# Patient Record
Sex: Male | Born: 1970 | Race: White | Hispanic: No | Marital: Married | Smoking: Never smoker
Health system: Southern US, Community
[De-identification: ages and names within clinical notes are randomized; demographics above are authoritative.]

## PROBLEM LIST (undated history)

## (undated) DIAGNOSIS — L570 Actinic keratosis: Secondary | ICD-10-CM

## (undated) DIAGNOSIS — T7840XA Allergy, unspecified, initial encounter: Secondary | ICD-10-CM

## (undated) DIAGNOSIS — M1990 Unspecified osteoarthritis, unspecified site: Secondary | ICD-10-CM

## (undated) DIAGNOSIS — K219 Gastro-esophageal reflux disease without esophagitis: Secondary | ICD-10-CM

## (undated) HISTORY — PX: FRACTURE SURGERY: SHX138

## (undated) HISTORY — DX: Actinic keratosis: L57.0

## (undated) HISTORY — DX: Gastro-esophageal reflux disease without esophagitis: K21.9

## (undated) HISTORY — DX: Allergy, unspecified, initial encounter: T78.40XA

## (undated) HISTORY — DX: Unspecified osteoarthritis, unspecified site: M19.90

---

## 2009-11-14 LAB — HM COLONOSCOPY

## 2015-12-17 ENCOUNTER — Other Ambulatory Visit: Payer: Self-pay

## 2015-12-17 ENCOUNTER — Emergency Department (HOSPITAL_COMMUNITY): Payer: BC Managed Care – PPO

## 2015-12-17 ENCOUNTER — Encounter (HOSPITAL_COMMUNITY): Payer: Self-pay | Admitting: Radiology

## 2015-12-17 ENCOUNTER — Emergency Department (HOSPITAL_COMMUNITY)
Admission: EM | Admit: 2015-12-17 | Discharge: 2015-12-17 | Disposition: A | Discharge status: Home / Self Care | Payer: BC Managed Care – PPO | Attending: Emergency Medicine | Admitting: Emergency Medicine

## 2015-12-17 DIAGNOSIS — Y9389 Activity, other specified: Secondary | ICD-10-CM | POA: Diagnosis not present

## 2015-12-17 DIAGNOSIS — Y92410 Unspecified street and highway as the place of occurrence of the external cause: Secondary | ICD-10-CM | POA: Diagnosis not present

## 2015-12-17 DIAGNOSIS — Y998 Other external cause status: Secondary | ICD-10-CM | POA: Diagnosis not present

## 2015-12-17 DIAGNOSIS — S79912A Unspecified injury of left hip, initial encounter: Secondary | ICD-10-CM | POA: Diagnosis not present

## 2015-12-17 DIAGNOSIS — S20212A Contusion of left front wall of thorax, initial encounter: Secondary | ICD-10-CM | POA: Insufficient documentation

## 2015-12-17 DIAGNOSIS — T07XXXA Unspecified multiple injuries, initial encounter: Secondary | ICD-10-CM

## 2015-12-17 DIAGNOSIS — Z79899 Other long term (current) drug therapy: Secondary | ICD-10-CM | POA: Insufficient documentation

## 2015-12-17 DIAGNOSIS — R7989 Other specified abnormal findings of blood chemistry: Secondary | ICD-10-CM | POA: Insufficient documentation

## 2015-12-17 DIAGNOSIS — S0992XA Unspecified injury of nose, initial encounter: Secondary | ICD-10-CM | POA: Insufficient documentation

## 2015-12-17 DIAGNOSIS — S51022A Laceration with foreign body of left elbow, initial encounter: Secondary | ICD-10-CM | POA: Diagnosis not present

## 2015-12-17 DIAGNOSIS — S29001A Unspecified injury of muscle and tendon of front wall of thorax, initial encounter: Secondary | ICD-10-CM | POA: Diagnosis present

## 2015-12-17 DIAGNOSIS — S79911A Unspecified injury of right hip, initial encounter: Secondary | ICD-10-CM | POA: Diagnosis not present

## 2015-12-17 LAB — I-STAT CHEM 8, ED
BUN: 16 mg/dL (ref 6–20)
CHLORIDE: 104 mmol/L (ref 101–111)
Calcium, Ion: 1.12 mmol/L (ref 1.12–1.23)
Creatinine, Ser: 1.3 mg/dL — ABNORMAL HIGH (ref 0.61–1.24)
GLUCOSE: 140 mg/dL — AB (ref 65–99)
HCT: 49 % (ref 39.0–52.0)
Hemoglobin: 16.7 g/dL (ref 13.0–17.0)
POTASSIUM: 4.1 mmol/L (ref 3.5–5.1)
SODIUM: 142 mmol/L (ref 135–145)
TCO2: 24 mmol/L (ref 0–100)

## 2015-12-17 MED ORDER — TETANUS-DIPHTH-ACELL PERTUSSIS 5-2.5-18.5 LF-MCG/0.5 IM SUSP
0.5000 mL | Freq: Once | INTRAMUSCULAR | Status: AC
  Administered 2015-12-17: 0.5 mL via INTRAMUSCULAR
  Filled 2015-12-17: qty 0.5

## 2015-12-17 MED ORDER — BACITRACIN ZINC 500 UNIT/GM EX OINT
TOPICAL_OINTMENT | Freq: Two times a day (BID) | CUTANEOUS | Status: DC
  Administered 2015-12-17: 1 via TOPICAL
  Filled 2015-12-17: qty 0.9

## 2015-12-17 MED ORDER — SODIUM CHLORIDE 0.9 % IV BOLUS (SEPSIS)
1000.0000 mL | Freq: Once | INTRAVENOUS | Status: AC
  Administered 2015-12-17: 1000 mL via INTRAVENOUS

## 2015-12-17 MED ORDER — CEPHALEXIN 250 MG PO CAPS
500.0000 mg | ORAL_CAPSULE | Freq: Once | ORAL | Status: AC
  Administered 2015-12-17: 500 mg via ORAL
  Filled 2015-12-17: qty 2

## 2015-12-17 MED ORDER — CEPHALEXIN 500 MG PO CAPS: 500.0000 mg | ORAL_CAPSULE | Freq: Four times a day (QID) | ORAL | Status: DC

## 2015-12-17 MED ORDER — IOHEXOL 300 MG/ML  SOLN
100.0000 mL | Freq: Once | INTRAMUSCULAR | Status: AC | PRN
  Administered 2015-12-17: 100 mL via INTRAVENOUS

## 2015-12-17 MED ORDER — OXYCODONE-ACETAMINOPHEN 5-325 MG PO TABS: ORAL_TABLET | ORAL | Status: DC

## 2015-12-17 MED ORDER — ONDANSETRON HCL 4 MG/2ML IJ SOLN
4.0000 mg | Freq: Once | INTRAMUSCULAR | Status: AC
  Administered 2015-12-17: 4 mg via INTRAVENOUS
  Filled 2015-12-17: qty 2

## 2015-12-17 MED ORDER — HYDROMORPHONE HCL 1 MG/ML IJ SOLN
0.5000 mg | Freq: Once | INTRAMUSCULAR | Status: AC
  Administered 2015-12-17: 0.5 mg via INTRAVENOUS
  Filled 2015-12-17: qty 1

## 2015-12-17 MED ORDER — MORPHINE SULFATE (PF) 4 MG/ML IV SOLN
4.0000 mg | Freq: Once | INTRAVENOUS | Status: AC
  Administered 2015-12-17: 4 mg via INTRAVENOUS
  Filled 2015-12-17: qty 1

## 2015-12-17 MED ORDER — LIDOCAINE-EPINEPHRINE-TETRACAINE (LET) SOLUTION
3.0000 mL | Freq: Once | NASAL | Status: AC
  Administered 2015-12-17: 3 mL via TOPICAL
  Filled 2015-12-17: qty 3

## 2015-12-17 MED ORDER — OXYCODONE-ACETAMINOPHEN 5-325 MG PO TABS
1.0000 | ORAL_TABLET | Freq: Once | ORAL | Status: AC
  Administered 2015-12-17: 1 via ORAL
  Filled 2015-12-17: qty 1

## 2015-12-17 NOTE — ED Notes (Signed)
Pt transported to radiology.
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
## 2015-12-17 NOTE — Discharge Instructions (Signed)
Take percocet for breakthrough pain, do not drink alcohol, drive, care for children or do other critical tasks while taking percocet.  Your kidney tests today are not normal. You need to drink plenty of water and have this rechecked by your primary care doctor in the next week. Do not take any NSAIDs (motrin, ibuprofen, Advil, aleve , aspirin, naproxen etc.) because they can further hurt your kidneys.   If you see signs of infection (warmth, redness, tenderness, pus, sharp increase in pain, fever) immediately return to the emergency department. Take your antibiotics as directed  1) Wash gently morning and night with soap and water. 2) You may use a topical antibiotic ointment (bacitracin triple antibiotic ointment or neosporin, for example) 3) Cover all exposed tissue with the Xeroform petroleum dressing you were given today 4) Apply gauze and tape Do NOT use rubbing alcohol or hydrogen peroxide  Please follow with your primary care doctor in the next 2 days for a check-up. They must obtain records for further management.   Do not hesitate to return to the Emergency Department for any new, worsening or concerning symptoms.   Chest Contusion A chest contusion is a deep bruise on your chest area. Contusions are the result of an injury that caused bleeding under the skin. A chest contusion may involve bruising of the skin, muscles, or ribs. The contusion may turn blue, purple, or yellow. Minor injuries will give you a painless contusion, but more severe contusions may stay painful and swollen for a few weeks. CAUSES  A contusion is usually caused by a blow, trauma, or direct force to an area of the body. SYMPTOMS   Swelling and redness of the injured area.  Discoloration of the injured area.  Tenderness and soreness of the injured area.  Pain. DIAGNOSIS  The diagnosis can be made by taking a history and performing a physical exam. An X-ray, CT scan, or MRI may be needed to determine if  there were any associated injuries, such as broken bones (fractures) or internal injuries. TREATMENT  Often, the best treatment for a chest contusion is resting, icing, and applying cold compresses to the injured area. Deep breathing exercises may be recommended to reduce the risk of pneumonia. Over-the-counter medicines may also be recommended for pain control. HOME CARE INSTRUCTIONS   Put ice on the injured area.  Put ice in a plastic bag.  Place a towel between your skin and the bag.  Leave the ice on for 15-20 minutes, 03-04 times a day.  Only take over-the-counter or prescription medicines as directed by your caregiver. Your caregiver may recommend avoiding anti-inflammatory medicines (aspirin, ibuprofen, and naproxen) for 48 hours because these medicines may increase bruising.  Rest the injured area.  Perform deep-breathing exercises as directed by your caregiver.  Stop smoking if you smoke.  Do not lift objects over 5 pounds (2.3 kg) for 3 days or longer if recommended by your caregiver. SEEK IMMEDIATE MEDICAL CARE IF:   You have increased bruising or swelling.  You have pain that is getting worse.  You have difficulty breathing.  You have dizziness, weakness, or fainting.  You have blood in your urine or stool.  You cough up or vomit blood.  Your swelling or pain is not relieved with medicines. MAKE SURE YOU:   Understand these instructions.  Will watch your condition.  Will get help right away if you are not doing well or get worse.   This information is not intended to replace advice  given to you by your health care provider. Make sure you discuss any questions you have with your health care provider.   Document Released: 07/03/2001 Document Revised: 07/02/2012 Document Reviewed: 03/31/2012 Elsevier Interactive Patient Education [employer]

## 2015-12-17 NOTE — ED Notes (Signed)
Pt returned from xray
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
## 2015-12-17 NOTE — ED Notes (Signed)
Printer not working on pod D told Delo to look in computer 

## 2015-12-17 NOTE — ED Notes (Signed)
Pt. Has bloody drainage to nasal area on assessment.

## 2015-12-17 NOTE — ED Notes (Signed)
Pt returned from CT °

## 2015-12-17 NOTE — ED Notes (Signed)
Pt transported to CT ?
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
## 2020-06-30 ENCOUNTER — Ambulatory Visit (HOSPITAL_COMMUNITY)
Admission: RE | Admit: 2020-06-30 | Discharge: 2020-06-30 | Disposition: A | Discharge status: Home / Self Care | Payer: BC Managed Care – PPO | Source: Ambulatory Visit | Attending: Pulmonary Disease | Admitting: Pulmonary Disease

## 2020-06-30 ENCOUNTER — Other Ambulatory Visit: Payer: Self-pay | Admitting: Oncology

## 2020-06-30 ENCOUNTER — Telehealth: Payer: Self-pay | Admitting: Oncology

## 2020-06-30 DIAGNOSIS — U071 COVID-19: Secondary | ICD-10-CM

## 2020-06-30 MED ORDER — EPINEPHRINE 0.3 MG/0.3ML IJ SOAJ: 0.3000 mg | Freq: Once | INTRAMUSCULAR | Status: DC | PRN

## 2020-06-30 MED ORDER — ALBUTEROL SULFATE HFA 108 (90 BASE) MCG/ACT IN AERS: 2.0000 | INHALATION_SPRAY | Freq: Once | RESPIRATORY_TRACT | Status: DC | PRN

## 2020-06-30 MED ORDER — FAMOTIDINE IN NACL 20-0.9 MG/50ML-% IV SOLN: 20.0000 mg | Freq: Once | INTRAVENOUS | Status: DC | PRN

## 2020-06-30 MED ORDER — METHYLPREDNISOLONE SODIUM SUCC 125 MG IJ SOLR: 125.0000 mg | Freq: Once | INTRAMUSCULAR | Status: DC | PRN

## 2020-06-30 MED ORDER — DIPHENHYDRAMINE HCL 50 MG/ML IJ SOLN: 50.0000 mg | Freq: Once | INTRAMUSCULAR | Status: DC | PRN

## 2020-06-30 MED ORDER — SODIUM CHLORIDE 0.9 % IV SOLN: INTRAVENOUS | Status: DC | PRN

## 2020-06-30 MED ORDER — SODIUM CHLORIDE 0.9 % IV SOLN
1200.0000 mg | Freq: Once | INTRAVENOUS | Status: AC
  Administered 2020-06-30: 1200 mg via INTRAVENOUS
  Filled 2020-06-30: qty 10

## 2020-06-30 NOTE — Progress Notes (Signed)
  Diagnosis: COVID-19  Physician:Dr. Wright  Procedure: [facility] Med: casirivimab\imdevimab infusion - Provided patient with casirivimab\imdevimab fact sheet for patients, parents and caregivers prior to infusion.  Complications: No immediate complications noted.  Discharge: Discharged home   Marc Barber 06/30/2020  

## 2020-06-30 NOTE — Telephone Encounter (Signed)
I connected by phone with  Marc Barber to discuss the potential use of an new treatment for mild to moderate COVID-19 viral infection in non-hospitalized patients.   This patient is a age/sex that meets the FDA criteria for Emergency Use Authorization of casirivimab\imdevimab.  Has a (+) direct SARS-CoV-2 viral test result 1. Has mild or moderate COVID-19  2. Is ? 47 years of age and weighs ? 40 kg 3. Is NOT hospitalized due to COVID-19 4. Is NOT requiring oxygen therapy or requiring an increase in baseline oxygen flow rate due to COVID-19 5. Is within 10 days of symptom onset 6. Has at least one of the high risk factor(s) for progression to severe COVID-19 and/or hospitalization as defined in EUA. ? Specific high risk criteria : Obesity   Symptom onset  06/26/2020   I have spoken and communicated the following to the patient or parent/caregiver:   1. FDA has authorized the emergency use of casirivimab\imdevimab for the treatment of mild to moderate COVID-19 in adults and pediatric patients with positive results of direct SARS-CoV-2 viral testing who are 61 years of age and older weighing at least 40 kg, and who are at high risk for progressing to severe COVID-19 and/or hospitalization.   2. The significant known and potential risks and benefits of casirivimab\imdevimab, and the extent to which such potential risks and benefits are unknown.   3. Information on available alternative treatments and the risks and benefits of those alternatives, including clinical trials.   4. Patients treated with casirivimab\imdevimab should continue to self-isolate and use infection control measures (e.g., wear mask, isolate, social distance, avoid sharing personal items, clean and disinfect "high touch" surfaces, and frequent handwashing) according to CDC guidelines.    5. The patient or parent/caregiver has the option to accept or refuse casirivimab\imdevimab .   After reviewing this information with the  patient, The patient agreed to proceed with receiving casirivimab\imdevimab infusion and will be provided a copy of the Fact sheet prior to receiving the infusion.Mignon Pine, AGNP-C [phone] (Infusion Center Hotline)

## 2020-06-30 NOTE — Discharge Instructions (Signed)

## 2023-09-10 ENCOUNTER — Ambulatory Visit: Payer: BC Managed Care – PPO | Admitting: Family Medicine

## 2023-09-10 ENCOUNTER — Ambulatory Visit
Admission: RE | Admit: 2023-09-10 | Discharge: 2023-09-10 | Disposition: A | Discharge status: Home / Self Care | Payer: BC Managed Care – PPO | Source: Ambulatory Visit | Attending: Family Medicine

## 2023-09-10 ENCOUNTER — Encounter: Payer: Self-pay | Admitting: Family Medicine

## 2023-09-10 VITALS — BP 136/74 | HR 70 | Temp 97.8°F | Ht 69.5 in | Wt 235.8 lb

## 2023-09-10 DIAGNOSIS — K219 Gastro-esophageal reflux disease without esophagitis: Secondary | ICD-10-CM | POA: Diagnosis not present

## 2023-09-10 DIAGNOSIS — R079 Chest pain, unspecified: Secondary | ICD-10-CM

## 2023-09-10 DIAGNOSIS — K222 Esophageal obstruction: Secondary | ICD-10-CM

## 2023-09-10 DIAGNOSIS — R2232 Localized swelling, mass and lump, left upper limb: Secondary | ICD-10-CM

## 2023-09-10 NOTE — Patient Instructions (Addendum)
-   Schedule an appointment with general surgery to evaluate your left arm mass. - Obtain a chest X-ray to assess your left rib cage pain. - Continue taking omeprazole daily as directed to manage your heartburn. - Return for fasting blood tests for physical

## 2023-09-10 NOTE — Progress Notes (Unsigned)
Assessment/Plan:   Problem List Items Addressed This Visit   None   There are no discontinued medications.  No follow-ups on file.    Subjective:   Encounter date: 09/10/2023  Marc Barber is a 52 y.o. male who does not have a problem list on file.Marland Kitchen   He  has no past medical history on file.Marland Kitchen   He presents with chief complaint of Establish Care (Growth on left upper arm , sore to the touch x 1 year. Left rib cage pain off and on. Patient would like to discuss colonoscopy options ) .   HPI:   ROS  History reviewed. No pertinent surgical history.  Outpatient Medications Prior to Visit  Medication Sig Dispense Refill   ibuprofen (ADVIL,MOTRIN) 400 MG tablet Take 400 mg by mouth every 6 (six) hours as needed for mild pain.     omeprazole (PRILOSEC) 20 MG capsule Take 20 mg by mouth daily.     cephALEXin (KEFLEX) 500 MG capsule Take 1 capsule (500 mg total) by mouth 4 (four) times daily. (Patient not taking: Reported on 09/10/2023) 20 capsule 0   oxyCODONE-acetaminophen (PERCOCET/ROXICET) 5-325 MG tablet 1 to 2 tabs PO q6hrs  PRN for pain (Patient not taking: Reported on 09/10/2023) 15 tablet 0   No facility-administered medications prior to visit.    Family History  Problem Relation Age of Onset   Diabetes Mother    Diabetes Father    Hypertension Father     Social History   Socioeconomic History   Marital status: Married    Spouse name: Not on file   Number of children: Not on file   Years of education: Not on file   Highest education level: Not on file  Occupational History   Not on file  Tobacco Use   Smoking status: Never    Passive exposure: Never   Smokeless tobacco: Never  Vaping Use   Vaping status: Never Used  Substance and Sexual Activity   Alcohol use: Yes    Comment: Occassionally   Drug use: Not on file   Sexual activity: Not on file  Other Topics Concern   Not on file  Social History Narrative   Not on file   Social Determinants of  Health   Financial Resource Strain: Not on file  Food Insecurity: Not on file  Transportation Needs: Not on file  Physical Activity: Not on file  Stress: Not on file  Social Connections: Not on file  Intimate Partner Violence: Not on file                                                                                                  Objective:  Physical Exam: BP 136/74 (BP Location: Left Arm, Patient Position: Sitting, Cuff Size: Large)   Pulse 70   Temp 97.8 F (36.6 C) (Temporal)   Ht 5' 9.5" (1.765 m)   Wt 235 lb 12.8 oz (107 kg)   SpO2 99%   BMI 34.32 kg/m     Physical Exam  No results found.  No results found for this or any previous  visit (from the past 2160 hour(s)).      Garner Nash, MD, MS

## 2023-09-12 DIAGNOSIS — R079 Chest pain, unspecified: Secondary | ICD-10-CM | POA: Insufficient documentation

## 2023-09-12 DIAGNOSIS — K222 Esophageal obstruction: Secondary | ICD-10-CM | POA: Insufficient documentation

## 2023-09-12 DIAGNOSIS — K219 Gastro-esophageal reflux disease without esophagitis: Secondary | ICD-10-CM | POA: Insufficient documentation

## 2023-09-12 DIAGNOSIS — R2232 Localized swelling, mass and lump, left upper limb: Secondary | ICD-10-CM | POA: Insufficient documentation

## 2023-09-12 NOTE — Assessment & Plan Note (Signed)
Stable on omeprazole  Plan: Continue current regimen of omeprazole 20 mg daily. Reinforce importance of medication adherence. Consider gastroenterology referral if symptoms worsen.

## 2023-09-12 NOTE — Assessment & Plan Note (Addendum)
Intermittent atypical left chest wall pain, tender to palpation, no associated respiratory or cardiac symptoms.  Differential Diagnosis: Costochondritis Muscular strain Fracture of the ribs Referred pain from GERD Less likely cardiac etiology  Plan: EKG performed today; sinus rhythm, within normal limits. Order chest X-ray to evaluate for musculoskeletal abnormalities or other pathology. Advise use of over-the-counter analgesics (e.g., NSAIDs) as needed for pain. Follow up as needed

## 2023-09-12 NOTE — Assessment & Plan Note (Signed)
Worsening left arm nodule causing pain and functional impairment.  Differential Diagnosis: Neural tumor (e.g., Schwannoma) Fibroma Lipoma (less likely due to hardness and pain) Malignant soft tissue tumor (e.g., sarcoma)  Plan: Refer to general surgery for evaluation and possible excision. No imaging prior to surgical consult per patient preference and clinical indication.

## 2023-10-08 ENCOUNTER — Encounter: Payer: BC Managed Care – PPO | Admitting: Family Medicine

## 2023-10-21 ENCOUNTER — Telehealth: Payer: Self-pay | Admitting: Family Medicine

## 2023-10-21 NOTE — Telephone Encounter (Signed)
error 

## 2023-10-31 ENCOUNTER — Encounter: Payer: BC Managed Care – PPO | Admitting: Family Medicine

## 2023-12-17 ENCOUNTER — Encounter: Payer: BC Managed Care – PPO | Admitting: Family Medicine

## 2024-01-07 ENCOUNTER — Ambulatory Visit (INDEPENDENT_AMBULATORY_CARE_PROVIDER_SITE_OTHER): Payer: BC Managed Care – PPO | Admitting: Family Medicine

## 2024-01-07 ENCOUNTER — Encounter: Payer: Self-pay | Admitting: Family Medicine

## 2024-01-07 VITALS — BP 130/84 | HR 82 | Temp 97.0°F | Ht 69.5 in | Wt 238.4 lb

## 2024-01-07 DIAGNOSIS — Z91013 Allergy to seafood: Secondary | ICD-10-CM | POA: Insufficient documentation

## 2024-01-07 DIAGNOSIS — R079 Chest pain, unspecified: Secondary | ICD-10-CM

## 2024-01-07 DIAGNOSIS — Z125 Encounter for screening for malignant neoplasm of prostate: Secondary | ICD-10-CM | POA: Diagnosis not present

## 2024-01-07 DIAGNOSIS — K222 Esophageal obstruction: Secondary | ICD-10-CM | POA: Diagnosis not present

## 2024-01-07 DIAGNOSIS — Z6834 Body mass index (BMI) 34.0-34.9, adult: Secondary | ICD-10-CM | POA: Diagnosis not present

## 2024-01-07 DIAGNOSIS — E6609 Other obesity due to excess calories: Secondary | ICD-10-CM

## 2024-01-07 DIAGNOSIS — Z0000 Encounter for general adult medical examination without abnormal findings: Secondary | ICD-10-CM | POA: Diagnosis not present

## 2024-01-07 DIAGNOSIS — Z1211 Encounter for screening for malignant neoplasm of colon: Secondary | ICD-10-CM

## 2024-01-07 DIAGNOSIS — E66811 Obesity, class 1: Secondary | ICD-10-CM

## 2024-01-07 DIAGNOSIS — L570 Actinic keratosis: Secondary | ICD-10-CM

## 2024-01-07 DIAGNOSIS — K219 Gastro-esophageal reflux disease without esophagitis: Secondary | ICD-10-CM

## 2024-01-07 LAB — COMPREHENSIVE METABOLIC PANEL
ALT: 30 U/L (ref 0–53)
AST: 19 U/L (ref 0–37)
Albumin: 4.9 g/dL (ref 3.5–5.2)
Alkaline Phosphatase: 69 U/L (ref 39–117)
BUN: 15 mg/dL (ref 6–23)
CO2: 26 meq/L (ref 19–32)
Calcium: 10.1 mg/dL (ref 8.4–10.5)
Chloride: 101 meq/L (ref 96–112)
Creatinine, Ser: 1.28 mg/dL (ref 0.40–1.50)
GFR: 65.28 mL/min (ref >60.00)
Glucose, Bld: 118 mg/dL — ABNORMAL HIGH (ref 70–99)
Potassium: 4.6 meq/L (ref 3.5–5.1)
Sodium: 137 meq/L (ref 135–145)
Total Bilirubin: 0.5 mg/dL (ref 0.2–1.2)
Total Protein: 7.5 g/dL (ref 6.0–8.3)

## 2024-01-07 LAB — CBC WITH DIFFERENTIAL/PLATELET
Basophils Absolute: 0.1 10*3/uL (ref 0.0–0.1)
Basophils Relative: 0.9 % (ref 0.0–3.0)
Eosinophils Absolute: 0.1 10*3/uL (ref 0.0–0.7)
Eosinophils Relative: 1.9 % (ref 0.0–5.0)
HCT: 47.3 % (ref 39.0–52.0)
Hemoglobin: 15.7 g/dL (ref 13.0–17.0)
Lymphocytes Relative: 25.2 % (ref 12.0–46.0)
Lymphs Abs: 1.6 10*3/uL (ref 0.7–4.0)
MCHC: 33.3 g/dL (ref 30.0–36.0)
MCV: 92.9 fl (ref 78.0–100.0)
Monocytes Absolute: 0.5 10*3/uL (ref 0.1–1.0)
Monocytes Relative: 7.9 % (ref 3.0–12.0)
Neutro Abs: 4.1 10*3/uL (ref 1.4–7.7)
Neutrophils Relative %: 64.1 % (ref 43.0–77.0)
Platelets: 272 10*3/uL (ref 150.0–400.0)
RBC: 5.09 Mil/uL (ref 4.22–5.81)
RDW: 13.3 % (ref 11.5–15.5)
WBC: 6.4 10*3/uL (ref 4.0–10.5)

## 2024-01-07 LAB — LIPID PANEL
Cholesterol: 238 mg/dL — ABNORMAL HIGH (ref 0–200)
HDL: 53.9 mg/dL (ref >39.00)
LDL Cholesterol: 168 mg/dL — ABNORMAL HIGH (ref 0–99)
NonHDL: 184.41
Total CHOL/HDL Ratio: 4
Triglycerides: 83 mg/dL (ref 0.0–149.0)
VLDL: 16.6 mg/dL (ref 0.0–40.0)

## 2024-01-07 LAB — MICROALBUMIN / CREATININE URINE RATIO
Creatinine,U: 57.8 mg/dL
Microalb Creat Ratio: UNDETERMINED mg/g (ref 0.0–30.0)
Microalb, Ur: <0.7 mg/dL

## 2024-01-07 LAB — TSH: TSH: 2.04 u[IU]/mL (ref 0.35–5.50)

## 2024-01-07 LAB — HEMOGLOBIN A1C: Hgb A1c MFr Bld: 6.2 % (ref 4.6–6.5)

## 2024-01-07 LAB — PSA: PSA: 0.71 ng/mL (ref 0.10–4.00)

## 2024-01-07 MED ORDER — EPINEPHRINE 0.3 MG/0.3ML IJ SOAJ: 0.3000 mg | INTRAMUSCULAR | 1 refills | Status: AC | PRN

## 2024-01-07 MED ORDER — OMEPRAZOLE 20 MG PO CPDR: 20.0000 mg | DELAYED_RELEASE_CAPSULE | Freq: Every day | ORAL | 3 refills | Status: AC

## 2024-01-07 NOTE — Progress Notes (Signed)
 Assessment  Assessment/Plan:      Chest Pain Intermittent chest pain, possibly musculoskeletal or related to GERD. Previous EKG and chest X-ray were normal. Symptoms improve with movement and are associated with physical activity and postprandial periods. Family history of heart disease. Discussed potential for heart disease despite normal initial tests and importance of further evaluation due to family history and persistent symptoms. - Refer to cardiology for further evaluation of potential heart disease. - Continue omeprazole for GERD management. - Refer to gastroenterology for evaluation of GERD and potential esophageal issues. - Order lab work to screen for heart disease, including cholesterol and diabetes.  GERD GERD with previous esophageal dilation. Symptoms managed with over-the-counter omeprazole. No recent follow-up with gastroenterology. Discussed potential link between GERD and chest pain, and need for gastroenterology evaluation to rule out esophageal issues. - Continue omeprazole 20 mg over-the-counter. - Refer to gastroenterology for evaluation and management of GERD.  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 No history of throat swelling. Inquires about need for an EpiPen. No official allergy testing conducted. Discussed importance of having an EpiPen due to potential risk of severe allergic reactions, even though throat swelling has not occurred. - Prescribe EpiPen for emergency use in case of severe allergic reaction. - Order baseline allergy testing to verify severity of shellfish allergy.  Actinic Keratosis Skin changes on the ear, possibly precancerous, consistent with actinic keratosis. Significant sun exposure due to past outdoor work. Discussed importance of dermatology evaluation to prevent progression to skin cancer. - Refer to dermatology for evaluation and management of actinic keratosis.  General Health  Maintenance Discussion of lifestyle modifications for heart disease prevention, including diet and exercise. BMI is 34, indicating obesity. Blood pressure is 130/84 mmHg. No recent eye examination. Dental check-up was six months ago. No need for HIV screening as per patient preference. Discussed importance of regular screenings and lifestyle changes to mitigate heart disease risk. - Encourage regular exercise and a healthy diet, focusing on whole grains, fresh fruits, and vegetables. - Recommend regular eye examinations. - Continue regular dental check-ups. - Screen for prostate cancer starting at age 56. - Screen for colon cancer with colonoscopy.  Follow-up Emphasized importance of follow-up with specialists to address potential health issues identified during the visit. - Schedule follow-up appointments with cardiology, gastroenterology, and dermatology. - Perform lab work for routine screening and heart disease risk assessment. - Return for annual physical in one year unless new symptoms develop.         Medications Discontinued During This Encounter  Medication Reason   omeprazole (PRILOSEC) 20 MG capsule Reorder    Patient Counseling(The following topics were reviewed and/or handout was given):  -Nutrition: Stressed importance of moderation in sodium/caffeine intake, saturated fat and cholesterol, caloric balance, sufficient intake of fresh fruits, vegetables, and fiber.  -Stressed the importance of regular exercise.   -Substance Abuse: Discussed cessation/primary prevention of tobacco, alcohol, or other drug use; driving or other dangerous activities under the influence; availability of treatment for abuse.   -Injury prevention: Discussed safety belts, safety helmets, smoke detector, smoking near bedding or upholstery.   -Sexuality: Discussed sexually transmitted diseases, partner selection, use of condoms, avoidance of unintended pregnancy and contraceptive alternatives.    -Dental health: Discussed importance of regular tooth brushing, flossing, and dental visits.  -Health maintenance and immunizations reviewed. Please refer to Health maintenance section.  Return to care in 1 year for next preventative visit.  Subjective:   Encounter date: 01/07/2024  Chief Complaint  Patient presents with   Annual Exam    Fasting. Patient would like to discuss colonoscopy options    Discussed the use of AI scribe software for clinical note transcription with the patient, who gave verbal consent to proceed.  History of Present Illness   Marc Barber is a 53 year old male who presents for an annual physical exam and evaluation of shrimp allergy.  He occasionally experiences allergic reactions when exposed to shrimp, either by ingestion or proximity during cooking. Symptoms include facial and eye swelling, but no throat swelling. Approximately seven years ago, he experienced throat itchiness and eye swelling after consuming shrimp. He has not undergone official allergy testing.  He has a history of GERD, which previously led to esophageal scarring and required esophageal dilation 10-12 years ago. He currently manages GERD with over-the-counter omeprazole 20 mg, reduced from a higher dose due to cost. He experiences intermittent chest discomfort related to eating, relieved by walking, located under the left rib, and present for about six months. No chest pain or shortness of breath, except for occasional discomfort after eating, which he associates with musculoskeletal or GERD-related issues.  He has a history of lipoma removal, which healed well, and skin changes on his ears for 10-15 years, including scaling and darkening. He applies lotion to manage scaling but has not consulted a dermatologist.  He is actively trying to improve his diet and increase physical activity, primarily through walking and hiking, achieving about 30 miles last week. He also engages in weight  training when possible. He wants to lose weight and is working towards this goal.  He has a family history of heart disease.            01/07/2024    9:15 AM 09/10/2023    1:12 PM  Depression screen PHQ 2/9  Decreased Interest 0 0  Down, Depressed, Hopeless 0 0  PHQ - 2 Score 0 0  Altered sleeping 0 1  Tired, decreased energy 1 0  Change in appetite 0 0  Feeling bad or failure about yourself  0 0  Trouble concentrating 0 0  Moving slowly or fidgety/restless 0 0  Suicidal thoughts 0 0  PHQ-9 Score 1 1  Difficult doing work/chores Not difficult at all Not difficult at all       01/07/2024    9:16 AM 09/10/2023    1:12 PM  GAD 7 : Generalized Anxiety Score  Nervous, Anxious, on Edge 0 0  Control/stop worrying 0 0  Worry too much - different things 0 0  Trouble relaxing 0 0  Restless 0 0  Easily annoyed or irritable 0 0  Afraid - awful might happen 0 0  Total GAD 7 Score 0 0  Anxiety Difficulty Not difficult at all Not difficult at all    Health Maintenance Due  Topic Date Due   Colonoscopy  11/15/2019       PMH:  The following were reviewed and entered/updated in epic: Past Medical History:  Diagnosis Date   Allergy Childhood   pollen allergies, bee stings, shell fish   Arthritis 2010-2011   Joint pain in lower back due to injury,  Joint pain in hips and hands comes and goes   GERD (gastroesophageal reflux disease) 2011    Patient Active Problem List   Diagnosis Date Noted   Shellfish allergy 01/07/2024   Nodule of skin of left upper extremity 09/12/2023   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    Past Surgical History:  Procedure Laterality Date   FRACTURE SURGERY  1988-1989   Right Elbow repair, removed fractured bone fragment from joint    Family History  Problem Relation Age of Onset   Diabetes Mother    Arthritis Mother    Hearing loss Mother    Diabetes Father    Hypertension Father    Heart disease Father    Arthritis Paternal Grandmother     Cancer Paternal Grandmother    Asthma Daughter    Diabetes Brother    Hypertension Brother    Asthma Daughter    Diabetes Brother    Hypertension Brother     Medications- reviewed and updated Outpatient Medications Prior to Visit  Medication Sig Dispense Refill   ibuprofen (ADVIL,MOTRIN) 400 MG tablet Take 400 mg by mouth every 6 (six) hours as needed for mild pain.     omeprazole (PRILOSEC) 20 MG capsule Take 20 mg by mouth daily.     No facility-administered medications prior to visit.    Allergies  Allergen Reactions   Shellfish Allergy Itching, Rash and Swelling    Social History   Socioeconomic History   Marital status: Married    Spouse name: Not on file   Number of children: Not on file   Years of education: Not on file   Highest education level: Bachelor's degree (e.g., BA, AB, BS)  Occupational History   Not on file  Tobacco Use   Smoking status: Never    Passive exposure: Never   Smokeless tobacco: Never  Vaping Use   Vaping status: Never Used  Substance and Sexual Activity   Alcohol use: Yes    Comment: Occassionally   Drug use: Never   Sexual activity: Yes  Other Topics Concern   Not on file  Social History Narrative   Not on file   Social Drivers of Health   Financial Resource Strain: Low Risk  (01/06/2024)   Overall Financial Resource Strain (CARDIA)    Difficulty of Paying Living Expenses: Not very hard  Food Insecurity: No Food Insecurity (01/06/2024)   Hunger Vital Sign    Worried About Running Out of Food in the Last Year: Never true    Ran Out of Food in the Last Year: Never true  Transportation Needs: No Transportation Needs (01/06/2024)   PRAPARE - Administrator, Civil Service (Medical): No    Lack of Transportation (Non-Medical): No  Physical Activity: Insufficiently Active (01/06/2024)   Exercise Vital Sign    Days of Exercise per Week: 4 days    Minutes of Exercise per Session: 30 min  Stress: Stress Concern Present  (01/06/2024)   [employer] of Occupational Health - Occupational Stress Questionnaire    Feeling of Stress : To some extent  Social Connections: Socially Integrated (01/06/2024)   Social Connection and Isolation Panel [NHANES]    Frequency of Communication with Friends and Family: Three times a week    Frequency of Social Gatherings with Friends and Family: Once a week    Attends Religious Services: More than 4 times per year    Active Member of Clubs or Organizations: Yes    Attends Engineer, structural: More than 4 times per year    Marital Status: Married           Objective:  Physical Exam: BP 130/84   Pulse 82   Temp (!) 97 F (36.1 C) (Temporal)   Ht 5' 9.5" (  1.765 m)   Wt 238 lb 6.4 oz (108.1 kg)   SpO2 100%   BMI 34.70 kg/m   Body mass index is 34.7 kg/m. Wt Readings from Last 3 Encounters:  01/07/24 238 lb 6.4 oz (108.1 kg)  09/10/23 235 lb 12.8 oz (107 kg)  12/17/15 232 lb (105.2 kg)    Physical Exam Constitutional:      General: He is not in acute distress.    Appearance: Normal appearance. He is not ill-appearing or toxic-appearing.  HENT:     Head: Normocephalic and atraumatic.     Right Ear: Hearing, tympanic membrane, ear canal and external ear normal. There is no impacted cerumen.     Left Ear: Hearing, tympanic membrane, ear canal and external ear normal. There is no impacted cerumen.     Nose: Nose normal. No congestion.     Mouth/Throat:     Lips: No lesions.     Mouth: Mucous membranes are moist.     Pharynx: Oropharynx is clear. No oropharyngeal exudate.  Eyes:     General: No scleral icterus.       Right eye: No discharge.        Left eye: No discharge.     Conjunctiva/sclera: Conjunctivae normal.     Pupils: Pupils are equal, round, and reactive to light.  Neck:     Thyroid: No thyroid mass, thyromegaly or thyroid tenderness.  Cardiovascular:     Rate and Rhythm: Normal rate and regular rhythm.     Pulses: Normal pulses.      Heart sounds: Normal heart sounds.  Pulmonary:     Effort: Pulmonary effort is normal. No respiratory distress.     Breath sounds: Normal breath sounds.  Chest:     Chest wall: Tenderness present.    Abdominal:     General: Abdomen is flat. Bowel sounds are normal.     Palpations: Abdomen is soft.  Musculoskeletal:        General: Normal range of motion.     Cervical back: Normal range of motion.     Right lower leg: No edema.     Left lower leg: No edema.  Lymphadenopathy:     Cervical: No cervical adenopathy.  Skin:    General: Skin is warm and dry.     Findings: Lesion present. No rash.     Comments: Actinic keratosis of bilateral ears with multiple nevi scattered across scalp and upper extremity  Neurological:     General: No focal deficit present.     Mental Status: He is alert and oriented to person, place, and time. Mental status is at baseline.     Deep Tendon Reflexes:     Reflex Scores:      Patellar reflexes are 2+ on the right side and 2+ on the left side. Psychiatric:        Mood and Affect: Mood normal.        Behavior: Behavior normal.        Thought Content: Thought content normal.        Judgment: Judgment normal.         At today's visit, we discussed treatment options, associated risk and benefits, and engage in counseling as needed.  Additionally the following were reviewed: Past medical records, past medical and surgical history, family and social background, as well as relevant laboratory results, imaging findings, and specialty notes, where applicable.  This message was generated using dictation software, and as a result, it may  contain unintentional typos or errors.  Nevertheless, extensive effort was made to accurately convey at the pertinent aspects of the patient visit.    There may have been are other unrelated non-urgent complaints, but due to the busy schedule and the amount of time already spent with him, time does not permit to address  these issues at today's visit. Another appointment may have or has been requested to review these additional issues.     Thomes Dinning, MD, MS

## 2024-01-07 NOTE — Patient Instructions (Signed)
 VISIT SUMMARY:  Today, you had your annual physical exam and we discussed several health concerns, including your shrimp allergy, GERD, intermittent chest pain, and skin changes on your ears. We also reviewed your general health maintenance and lifestyle habits.  YOUR PLAN:  -CHEST PAIN: Your intermittent chest pain may be related to your GERD or musculoskeletal issues, but given your family history of heart disease, we need to investigate further. We will refer you to a cardiologist for a detailed evaluation and order lab work to screen for heart disease, including cholesterol and diabetes. Continue taking omeprazole for GERD management.  -GERD: GERD is a condition where stomach acid frequently flows back into the tube connecting your mouth and stomach. You have a history of esophageal dilation due to GERD. We will refer you to a gastroenterologist for further evaluation and management. Continue taking over-the-counter omeprazole 20 mg.  -SHELLFISH ALLERGY: You have allergic reactions to shrimp, including facial and eye swelling. Although you haven't experienced throat swelling, it's important to be prepared for severe reactions. We will prescribe an EpiPen for emergency use and order allergy testing to determine the severity of your shellfish allergy.  -ACTINIC KERATOSIS: Actinic keratosis is a rough, scaly patch on your skin that develops from years of sun exposure and can potentially turn into skin cancer. We will refer you to a dermatologist for evaluation and management to prevent progression.  -GENERAL HEALTH MAINTENANCE: We discussed the importance of regular exercise and a healthy diet to prevent heart disease. Your BMI is 34, indicating obesity, and your blood pressure is 130/84 mmHg. We recommend regular eye exams, continuing your dental check-ups, and screening for prostate and colon cancer.  INSTRUCTIONS:  Please schedule follow-up appointments with cardiology, gastroenterology, and  dermatology. Perform lab work for routine screening and heart disease risk assessment. Return for your annual physical in one year unless new symptoms develop.

## 2024-01-08 LAB — ALLERGY-SHELLFISH PANEL
CLASS: 0
CLASS: 0
CLASS: 0
Clams: <0.1 kU/L
Class: 0
Crab: <0.1 kU/L
Lobster: <0.1 kU/L
Shrimp IgE: <0.1 kU/L

## 2024-01-08 LAB — URINALYSIS W MICROSCOPIC + REFLEX CULTURE

## 2024-01-08 LAB — INTERPRETATION:

## 2024-01-09 ENCOUNTER — Encounter: Payer: Self-pay | Admitting: Family Medicine

## 2024-03-23 ENCOUNTER — Ambulatory Visit: Admitting: Gastroenterology

## 2024-03-23 ENCOUNTER — Encounter: Payer: Self-pay | Admitting: Gastroenterology

## 2024-03-23 VITALS — BP 128/74 | HR 65 | Ht 70.0 in | Wt 231.0 lb

## 2024-03-23 DIAGNOSIS — R0781 Pleurodynia: Secondary | ICD-10-CM

## 2024-03-23 DIAGNOSIS — K200 Eosinophilic esophagitis: Secondary | ICD-10-CM | POA: Diagnosis not present

## 2024-03-23 DIAGNOSIS — Z1211 Encounter for screening for malignant neoplasm of colon: Secondary | ICD-10-CM

## 2024-03-23 NOTE — Progress Notes (Signed)
 Chief Complaint: history of esophageal stricture, colon cancer screening Primary GI Doctor: Dr. Karene Oto  HPI: Patient is a 53 year old male patient with past medical history of GERD, arthritis, who was referred to me by Catheryn Cluck, MD on 01/07/24 for a complaint of esophageal stricture and colon cancer screening .    01/07/24 seen by PCP with intermittent chest pain, possibly musculoskeletal or related to GERD. Previous EKG and chest X-ray were normal. Symptoms improve with movement and are associated with physical activity and postprandial periods. Referral to GI and cardiology.  Interval History    Patient presents for evaluation for atypical chest pain and discuss colon screening colonoscopy. Patient states the chest pain has improved. He had it intermittently since October 2024. He describes it as a intermittent discomfort on the left side between his ribcage, intercostal. He states he has not had the pain in over a month. The pain was not related to position or movement . He notes when he took Ibuprofen it seemed to help. No SOB or dizziness.      Patient also has history of GERD and taking Omeprazole  since 2011 where he had a esophageal stricture and it had to be dilated. He states he was diagnosed with eosinophilic esophagitis on EGD.  He has occasional heartburn, managed with dietary modifications and taking the Omeprazole . If he misses a dose he has pyrosis.  He denies dysphagia. Patient denies nausea, vomiting, or weight loss.  Patient last colonoscopy in 2011 and per patient was normal.  Patient denies altered bowel habits, abdominal pain, or rectal bleeding.  Socially drinks. Nonsmoker.  Denies family history of GI issues or cancer.   Wt Readings from Last 3 Encounters:  03/23/24 231 lb (104.8 kg)  01/07/24 238 lb 6.4 oz (108.1 kg)  09/10/23 235 lb 12.8 oz (107 kg)    Past Medical History:  Diagnosis Date   Allergy  Childhood   pollen allergies, bee stings, shell  fish   Arthritis 2010-2011   Joint pain in lower back due to injury,  Joint pain in hips and hands comes and goes   GERD (gastroesophageal reflux disease) 2011    Past Surgical History:  Procedure Laterality Date   FRACTURE SURGERY  1988-1989   Right Elbow repair, removed fractured bone fragment from joint    Current Outpatient Medications  Medication Sig Dispense Refill   EPINEPHrine  (EPIPEN  2-PAK) 0.3 mg/0.3 mL IJ SOAJ injection Inject 0.3 mg into the muscle as needed for anaphylaxis. 1 each 1   omeprazole  (PRILOSEC) 20 MG capsule Take 1 capsule (20 mg total) by mouth daily. 90 capsule 3   No current facility-administered medications for this visit.    Allergies as of 03/23/2024 - Review Complete 03/23/2024  Allergen Reaction Noted   Shellfish allergy  Itching, Rash, and Swelling 07/22/2016    Family History  Problem Relation Age of Onset   Diabetes Mother    Arthritis Mother    Hearing loss Mother    Diabetes Father    Hypertension Father    Heart disease Father    Arthritis Paternal Grandmother    Cancer Paternal Grandmother    Asthma Daughter    Diabetes Brother    Hypertension Brother    Asthma Daughter    Diabetes Brother    Hypertension Brother     Review of Systems:    Constitutional: No weight loss, fever, chills, weakness or fatigue HEENT: Eyes: No change in vision  Ears, Nose, Throat:  No change in hearing or congestion Skin: No rash or itching Cardiovascular: No chest pain, chest pressure or palpitations   Respiratory: No SOB or cough Gastrointestinal: See HPI and otherwise negative Genitourinary: No dysuria or change in urinary frequency Neurological: No headache, dizziness or syncope Musculoskeletal: No new muscle or joint pain Hematologic: No bleeding or bruising Psychiatric: No history of depression or anxiety    Physical Exam:  Vital signs: BP 128/74   Pulse 65   Ht 5\' 10"  (1.778 m)   Wt 231 lb (104.8 kg)   BMI 33.15 kg/m    Constitutional: Pleasant male appears to be in NAD, Well developed, Well nourished, alert and cooperative Throat: Oral cavity and pharynx without inflammation, swelling or lesion.  Respiratory: Respirations even and unlabored. Lungs clear to auscultation bilaterally.   No wheezes, crackles, or rhonchi.  Cardiovascular: Normal S1, S2. Regular rate and rhythm. No peripheral edema, cyanosis or pallor.  Gastrointestinal:  Soft, nondistended, nontender. No rebound or guarding. Normal bowel sounds. No appreciable masses or hepatomegaly. Rectal:  Not performed.  Msk:  Symmetrical without gross deformities. Without edema, no deformity or joint abnormality.  Neurologic:  Alert and  oriented x4;  grossly normal neurologically.  Skin:   Dry and intact without significant lesions or rashes. Psychiatric: Oriented to person, place and time. Demonstrates good judgement and reason without abnormal affect or behaviors.  RELEVANT LABS AND IMAGING: CBC    Latest Ref Rng & Units 01/07/2024   10:11 AM 12/17/2015   11:21 AM  CBC  WBC 4.0 - 10.5 K/uL 6.4    Hemoglobin 13.0 - 17.0 g/dL 84.6  96.2   Hematocrit 39.0 - 52.0 % 47.3  49.0   Platelets 150.0 - 400.0 K/uL 272.0       CMP     Latest Ref Rng & Units 01/07/2024   10:11 AM 12/17/2015   11:21 AM  CMP  Glucose 70 - 99 mg/dL 952  841   BUN 6 - 23 mg/dL 15  16   Creatinine 3.24 - 1.50 mg/dL 4.01  0.27   Sodium 253 - 145 mEq/L 137  142   Potassium 3.5 - 5.1 mEq/L 4.6  4.1   Chloride 96 - 112 mEq/L 101  104   CO2 19 - 32 mEq/L 26    Calcium 8.4 - 10.5 mg/dL 66.4    Total Protein 6.0 - 8.3 g/dL 7.5    Total Bilirubin 0.2 - 1.2 mg/dL 0.5    Alkaline Phos 39 - 117 U/L 69    AST 0 - 37 U/L 19    ALT 0 - 53 U/L 30       Lab Results  Component Value Date   TSH 2.04 01/07/2024   11/14/09 colonoscopy, recall 10 years  Assessment: Encounter Diagnoses  Name Primary?   Eosinophilic esophagitis Yes   Special screening for malignant neoplasms, colon     Rib pain on left side     53 year old male patient who presents for evaluation of atypical chest pain with history of EOE and colon screening colonoscopy. After having patient describe his pain it is more located between his left ribs relieved with NSAID's indicating possible costochondritis? Does not relate pain to eating. Per patient it has been resolved for one month, no changes to his current routine. The pain will happen sporadically. He does note history of EOE that is managed with Omeprazole  40 mg po daily, but admits if he skips a dose  he has pyrosis. Denies dysphagia. He has not made an appointment with cardiology yet. Once cleared would plan for upper GI endoscopy to r/o stricture or narrowing of esophagus that Kaela Beitz relate to intermittent symptoms? Continue PPI therapy for now, will discuss other options after endoscopy such as FDA Dupixent.    Patient also due for colon screening colonoscopy, will schedule once cleared in [facility] with Dr. Karene Oto. No lower GI issues. Will discuss with CRNA Rogena Class for clearance in [facility].  Plan: -Continue Omeprazole  40 mg po daily -We discussed scheduling EGD/colon once cleared in [facility] with Dr. Karene Oto. The risks and benefits of EGD with possible biopsies and esophageal dilation were discussed with the patient who agrees to proceed. The risks and benefits of colonoscopy with possible polypectomy / biopsies were discussed and the patient agrees to proceed.  -Per Rogena Class, needs to be cleared by cardiology prior to procedures.   Thank you for the courtesy of this consult. Please call me with any questions or concerns.   Blondina Coderre, FNP-C  Gastroenterology 03/23/2024, 12:17 PM  Cc: Catheryn Cluck, MD

## 2024-03-23 NOTE — Patient Instructions (Signed)
 Continue Omeprazole .  We will call you to schedule your procedures.  _______________________________________________________  If your blood pressure at your visit was 140/90 or greater, please contact your primary care physician to follow up on this.  _______________________________________________________  If you are age 51 or older, your body mass index should be between 23-30. Your Body mass index is 33.15 kg/m. If this is out of the aforementioned range listed, please consider follow up with your Primary Care Provider.  If you are age 72 or younger, your body mass index should be between 19-25. Your Body mass index is 33.15 kg/m. If this is out of the aformentioned range listed, please consider follow up with your Primary Care Provider.   ________________________________________________________  The [facility] providers would like to encourage you to use MYCHART to communicate with providers for non-urgent requests or questions.  Due to long hold times on the telephone, sending your provider a message by [facility] may be a faster and more efficient way to get a response.  Please allow 48 business hours for a response.  Please remember that this is for non-urgent requests.  _______________________________________________________

## 2024-03-26 ENCOUNTER — Telehealth: Payer: Self-pay

## 2024-03-26 NOTE — Telephone Encounter (Signed)
-----   Message from Devin Foerster May sent at 03/26/2024 12:06 PM EDT ----- Regarding: FW: Procedure clearnce Tyra Galley- let patient know the anesthesiologist wants patient to be cleared by cardiology before we proceed with any procedures. So we recommend he schedule a appointment at his earliest convenience and then notify us  once he does.  Edsel Grace, NP ----- Message ----- From: Rogena Class, CRNA Sent: 03/25/2024   1:04 PM EDT To: Devin Foerster May, NP Subject: RE: Procedure clearnce                         [name],  Since this exam seems non-urgent, best to wait until cleared by [facility].  Thanks,  Cathryn Cobb ----- Message ----- From: May, [name] J, NP Sent: 03/23/2024  10:56 AM EDT To: Rogena Class, CRNA Subject: Procedure clearnce                             Autry Legions, Can you review note. They put atypical chest pain for patient but when he came it he showed me he had pain on left ribcage that he has not had in over a month. There is a cardiac referral however he has not made appt. He needs double. Not urgent. Please let me know if this is something we can set up.  Thanks, [name]

## 2024-03-26 NOTE — Telephone Encounter (Signed)
 [facility] Pre-operative Risk Assessment     Request for surgical clearance:     Endoscopy Procedure  What type of surgery is being performed?     endoscopic  When is this surgery scheduled?     TBD  What type of clearance is required ?   Cardiac  Are there any medications that need to be held prior to surgery and how long? No, unless he is put on a blood thinner at appt  Practice name and name of physician performing surgery?      [facility]  What is your office phone and fax number?      Phone- [phone]  Fax- [phone]  Anesthesia type (None, local, MAC, general) ?       MAC   Please route your response to [employer]

## 2024-04-10 NOTE — Progress Notes (Unsigned)
 Cardiology Office Note:    Date:  04/10/2024   ID:  Marc Barber, DOB 01/29/1971, MRN [id number]  PCP:  Catheryn Cluck, MD   [facility] Providers Cardiologist:  None { Click to update primary MD,subspecialty MD or APP then REFRESH:1}    Referring MD: Catheryn Cluck, MD   No chief complaint on file. ***  History of Present Illness:    Marc Barber is a 53 y.o. male is seen at the request  of Dr Hildy Lowers for evaluation of chest pain.   Past Medical History:  Diagnosis Date   Actinic keratosis    Allergy  Childhood   pollen allergies, bee stings, shell fish   Arthritis 2010-2011   Joint pain in lower back due to injury,  Joint pain in hips and hands comes and goes   GERD (gastroesophageal reflux disease) 2011    Past Surgical History:  Procedure Laterality Date   FRACTURE SURGERY  1988-1989   Right Elbow repair, removed fractured bone fragment from joint    Current Medications: No outpatient medications have been marked as taking for the 04/13/24 encounter (Appointment) with [place], Jamiee Milholland M, MD.     Allergies:   Shellfish allergy    Social History   Socioeconomic History   Marital status: Married    Spouse name: Not on file   Number of children: Not on file   Years of education: Not on file   Highest education level: Bachelor's degree (e.g., BA, AB, BS)  Occupational History   Not on file  Tobacco Use   Smoking status: Never    Passive exposure: Never   Smokeless tobacco: Never  Vaping Use   Vaping status: Never Used  Substance and Sexual Activity   Alcohol use: Yes    Comment: Occassionally   Drug use: Never   Sexual activity: Yes  Other Topics Concern   Not on file  Social History Narrative   Not on file   Social Drivers of Health   Financial Resource Strain: Low Risk  (01/06/2024)   Overall Financial Resource Strain (CARDIA)    Difficulty of Paying Living Expenses: Not very hard  Food Insecurity: No Food Insecurity (01/06/2024)    Hunger Vital Sign    Worried About Running Out of Food in the Last Year: Never true    Ran Out of Food in the Last Year: Never true  Transportation Needs: No Transportation Needs (01/06/2024)   PRAPARE - Administrator, Civil Service (Medical): No    Lack of Transportation (Non-Medical): No  Physical Activity: Insufficiently Active (01/06/2024)   Exercise Vital Sign    Days of Exercise per Week: 4 days    Minutes of Exercise per Session: 30 min  Stress: Stress Concern Present (01/06/2024)   [employer] of Occupational Health - Occupational Stress Questionnaire    Feeling of Stress : To some extent  Social Connections: Socially Integrated (01/06/2024)   Social Connection and Isolation Panel    Frequency of Communication with Friends and Family: Three times a week    Frequency of Social Gatherings with Friends and Family: Once a week    Attends Religious Services: More than 4 times per year    Active Member of [employer] or Organizations: Yes    Attends Engineer, structural: More than 4 times per year    Marital Status: Married     Family History: The patient's ***family history includes Arthritis in his mother and paternal grandmother; Asthma in his daughter and  daughter; Cancer in his paternal grandmother; Diabetes in his brother, brother, father, and mother; Hearing loss in his mother; Heart disease in his father; Hypertension in his brother, brother, and father.  ROS:   Please see the history of present illness.    *** All other systems reviewed and are negative.  EKGs/Labs/Other Studies Reviewed:    The following studies were reviewed today: ***      Recent Labs: 01/07/2024: ALT 30; BUN 15; Creatinine, Ser 1.28; Hemoglobin 15.7; Platelets 272.0; Potassium 4.6; Sodium 137; TSH 2.04  Recent Lipid Panel    Component Value Date/Time   CHOL 238 (H) 01/07/2024 1011   TRIG 83.0 01/07/2024 1011   HDL 53.90 01/07/2024 1011   CHOLHDL 4 01/07/2024 1011   VLDL  16.6 01/07/2024 1011   LDLCALC 168 (H) 01/07/2024 1011     Risk Assessment/Calculations:   {Does this patient have ATRIAL FIBRILLATION?:[phone]}  No BP recorded.  {Refresh Note OR Click here to enter BP  :1}***         Physical Exam:    VS:  There were no vitals taken for this visit.    Wt Readings from Last 3 Encounters:  03/23/24 231 lb (104.8 kg)  01/07/24 238 lb 6.4 oz (108.1 kg)  09/10/23 235 lb 12.8 oz (107 kg)     GEN: *** Well nourished, well developed in no acute distress HEENT: Normal NECK: No JVD; No carotid bruits LYMPHATICS: No lymphadenopathy CARDIAC: ***RRR, no murmurs, rubs, gallops RESPIRATORY:  Clear to auscultation without rales, wheezing or rhonchi  ABDOMEN: Soft, non-tender, non-distended MUSCULOSKELETAL:  No edema; No deformity  SKIN: Warm and dry NEUROLOGIC:  Alert and oriented x 3 PSYCHIATRIC:  Normal affect   ASSESSMENT:    No diagnosis found. PLAN:    In order of problems listed above:  ***      {Are you ordering a CV Procedure (e.g. stress test, cath, DCCV, TEE, etc)?   Press F2        :[id number]}    Medication Adjustments/Labs and Tests Ordered: Current medicines are reviewed at length with the patient today.  Concerns regarding medicines are outlined above.  No orders of the defined types were placed in this encounter.  No orders of the defined types were placed in this encounter.   There are no Patient Instructions on file for this visit.   Signed, Nayellie Sanseverino [place], MD  04/10/2024 7:37 AM    [facility]

## 2024-04-13 ENCOUNTER — Ambulatory Visit: Attending: Cardiology | Admitting: Cardiology

## 2024-04-13 ENCOUNTER — Encounter: Payer: Self-pay | Admitting: Cardiology

## 2024-04-13 VITALS — BP 122/80 | HR 74 | Ht 70.0 in | Wt 231.6 lb

## 2024-04-13 DIAGNOSIS — R0789 Other chest pain: Secondary | ICD-10-CM | POA: Diagnosis not present

## 2024-04-13 NOTE — Patient Instructions (Signed)

## 2024-04-17 ENCOUNTER — Encounter: Payer: Self-pay | Admitting: Family Medicine

## 2024-04-28 NOTE — Progress Notes (Signed)
 Agree with the assessment and plan as outlined by [facility], FNP-C.  Patient has been seen by Cardiology and does not appear that any further testing being ordered.  Ok to move forward with scheduling EGD/colonoscopy in the [facility].  Jaicey Sweaney, DO, [facility]

## 2024-05-04 ENCOUNTER — Other Ambulatory Visit: Payer: Self-pay

## 2024-05-04 ENCOUNTER — Telehealth: Payer: Self-pay

## 2024-05-04 DIAGNOSIS — Z1211 Encounter for screening for malignant neoplasm of colon: Secondary | ICD-10-CM

## 2024-05-04 DIAGNOSIS — K200 Eosinophilic esophagitis: Secondary | ICD-10-CM

## 2024-05-04 MED ORDER — NA SULFATE-K SULFATE-MG SULF 17.5-3.13-1.6 GM/177ML PO SOLN: 1.0000 | Freq: Once | ORAL | 0 refills | Status: AC

## 2024-05-04 NOTE — Telephone Encounter (Signed)
-----   Message from Cathryne PARAS May sent at 04/29/2024  3:04 PM EDT ----- Regarding: schedule procedures [name] Patient cleared for procedures schedule with Dr. Jerie- EGD- atypical chest pain with history of Eosinophilic esophagitis Colon- colon screening  [name], NP ----- Message ----- From: San Sandor GAILS, DO Sent: 04/28/2024  12:56 PM EDT To: Nat VEAR Sic, CMA; Cathryne PARAS May, NP

## 2024-05-08 NOTE — Telephone Encounter (Signed)
 Now that patient has been seen by your practice, could you please address whether he is an acceptable candidate for his procedure which is 06/26/2024.  Thanks!

## 2024-05-14 NOTE — Telephone Encounter (Signed)
 Called and left message for patient that he has cardiac clearance from Dr. [place] for September procedure. Also relayed message that if the patient had any questions to feel free to call the office.

## 2024-06-25 ENCOUNTER — Telehealth: Payer: Self-pay | Admitting: Gastroenterology

## 2024-06-25 NOTE — Telephone Encounter (Signed)
 Good morning Dr. San,   Patient called requesting to reschedule procedures for tomorrow 9/5. States he thought he had allergies but has been feeling worse and developed a severe cough. Patient has been rescheduled to 10/8.   Thank you

## 2024-06-26 ENCOUNTER — Encounter: Admitting: Gastroenterology

## 2024-07-29 ENCOUNTER — Encounter: Admitting: Gastroenterology

## 2024-10-02 ENCOUNTER — Ambulatory Visit: Admitting: Gastroenterology

## 2024-10-02 ENCOUNTER — Encounter: Payer: Self-pay | Admitting: Gastroenterology

## 2024-10-02 VITALS — BP 125/95 | HR 87 | Temp 98.2°F | Resp 13 | Ht 70.0 in | Wt 231.0 lb

## 2024-10-02 DIAGNOSIS — K5730 Diverticulosis of large intestine without perforation or abscess without bleeding: Secondary | ICD-10-CM

## 2024-10-02 DIAGNOSIS — K640 First degree hemorrhoids: Secondary | ICD-10-CM

## 2024-10-02 DIAGNOSIS — K222 Esophageal obstruction: Secondary | ICD-10-CM

## 2024-10-02 DIAGNOSIS — D125 Benign neoplasm of sigmoid colon: Secondary | ICD-10-CM

## 2024-10-02 DIAGNOSIS — K219 Gastro-esophageal reflux disease without esophagitis: Secondary | ICD-10-CM

## 2024-10-02 DIAGNOSIS — Z1211 Encounter for screening for malignant neoplasm of colon: Secondary | ICD-10-CM

## 2024-10-02 DIAGNOSIS — K317 Polyp of stomach and duodenum: Secondary | ICD-10-CM | POA: Diagnosis not present

## 2024-10-02 MED ORDER — SODIUM CHLORIDE 0.9 % IV SOLN: 500.0000 mL | Freq: Once | INTRAVENOUS | Status: AC

## 2024-10-02 NOTE — Progress Notes (Signed)
 GASTROENTEROLOGY PROCEDURE H&P NOTE   Primary Care Physician: Sebastian Beverley NOVAK, MD    Reason for Procedure:  Dysphagia, history of esophageal stricture, colon cancer screening, atypical noncardiac chest pain, GERD  Plan:    EGD, colonoscopy  Patient is appropriate for endoscopic procedure(s) in the ambulatory ([facility]) setting.  The nature of the procedure, as well as the risks, benefits, and alternatives were carefully and thoroughly reviewed with the patient. Ample time for discussion and questions allowed. The patient understood, was satisfied, and agreed to proceed. I personally addressed all patient questions and concerns.     HPI: Marc Barber is a 53 y.o. male who presents for EGD for evaluation of GERD, dysphagia, history of Esophageal stricture s/p dilation >10 years ago, noncardiac chest pain, along with colonoscopy for routine CRC screening.  Last colonoscopy was >10 years ago and normal per patient.  Currently taking omeprazole  40 mg daily.  Was seen in the Cardiology Clinic for atypical chest pain; felt most consistent with MSK pain.  Otherwise benign cardiology workup.  Past Medical History:  Diagnosis Date   Actinic keratosis    Allergy  Childhood   pollen allergies, bee stings, shell fish   Arthritis 2010-2011   Joint pain in lower back due to injury,  Joint pain in hips and hands comes and goes   GERD (gastroesophageal reflux disease) 2011    Past Surgical History:  Procedure Laterality Date   FRACTURE SURGERY  1988-1989   Right Elbow repair, removed fractured bone fragment from joint    Prior to Admission medications  Medication Sig Start Date End Date Taking? Authorizing Provider  albuterol  (VENTOLIN  HFA) 108 (90 Base) MCG/ACT inhaler SMARTSIG:2 MCG By Mouth Every 4 Hours PRN 06/27/24  Yes [provider]  fluorouracil (EFUDEX) 5 % cream Apply topically. 08/10/24  Yes [provider]  omeprazole  (PRILOSEC) 20 MG capsule Take 1 capsule (20  mg total) by mouth daily. 01/07/24 01/01/25 Yes Sebastian Beverley NOVAK, MD  EPINEPHrine  (EPIPEN  2-PAK) 0.3 mg/0.3 mL IJ SOAJ injection Inject 0.3 mg into the muscle as needed for anaphylaxis. 01/07/24   Sebastian Beverley NOVAK, MD    Current Outpatient Medications  Medication Sig Dispense Refill   albuterol  (VENTOLIN  HFA) 108 (90 Base) MCG/ACT inhaler SMARTSIG:2 MCG By Mouth Every 4 Hours PRN     fluorouracil (EFUDEX) 5 % cream Apply topically.     omeprazole  (PRILOSEC) 20 MG capsule Take 1 capsule (20 mg total) by mouth daily. 90 capsule 3   EPINEPHrine  (EPIPEN  2-PAK) 0.3 mg/0.3 mL IJ SOAJ injection Inject 0.3 mg into the muscle as needed for anaphylaxis. 1 each 1   Current Facility-Administered Medications  Medication Dose Route Frequency Provider Last Rate Last Admin   0.9 %  sodium chloride  infusion  500 mL Intravenous Once Ayven Glasco V, DO        Allergies as of 10/02/2024 - Review Complete 10/02/2024  Allergen Reaction Noted   Shellfish allergy  Itching, Rash, and Swelling 07/22/2016    Family History  Problem Relation Age of Onset   Diabetes Mother    Arthritis Mother    Hearing loss Mother    Heart disease Father        valve repair, aneurysm repair   Diabetes Father    Hypertension Father    Diabetes Brother    Hypertension Brother    Diabetes Brother    Hypertension Brother    Arthritis Paternal Grandmother    Cancer Paternal Grandmother    Asthma Daughter  Asthma Daughter    Colon cancer Neg Hx    Esophageal cancer Neg Hx    Stomach cancer Neg Hx    Rectal cancer Neg Hx     Social History   Socioeconomic History   Marital status: Married    Spouse name: Not on file   Number of children: 2   Years of education: Not on file   Highest education level: Bachelor's degree (e.g., BA, AB, BS)  Occupational History   Not on file  Tobacco Use   Smoking status: Never    Passive exposure: Never   Smokeless tobacco: Never  Vaping Use   Vaping status: Never Used   Substance and Sexual Activity   Alcohol use: Yes    Comment: Occassionally   Drug use: Never   Sexual activity: Yes  Other Topics Concern   Not on file  Social History Narrative   [employer] service.      Social Drivers of Health   Tobacco Use: Low Risk (10/02/2024)   Patient History    Smoking Tobacco Use: Never    Smokeless Tobacco Use: Never    Passive Exposure: Never  Financial Resource Strain: Low Risk (01/06/2024)   Overall Financial Resource Strain (CARDIA)    Difficulty of Paying Living Expenses: Not very hard  Food Insecurity: No Food Insecurity (01/06/2024)   Hunger Vital Sign    Worried About Running Out of Food in the Last Year: Never true    Ran Out of Food in the Last Year: Never true  Transportation Needs: No Transportation Needs (01/06/2024)   PRAPARE - Administrator, Civil Service (Medical): No    Lack of Transportation (Non-Medical): No  Physical Activity: Insufficiently Active (01/06/2024)   Exercise Vital Sign    Days of Exercise per Week: 4 days    Minutes of Exercise per Session: 30 min  Stress: Stress Concern Present (01/06/2024)   [employer] of Occupational Health - Occupational Stress Questionnaire    Feeling of Stress : To some extent  Social Connections: Socially Integrated (01/06/2024)   Social Connection and Isolation Panel    Frequency of Communication with Friends and Family: Three times a week    Frequency of Social Gatherings with Friends and Family: Once a week    Attends Religious Services: More than 4 times per year    Active Member of Clubs or Organizations: Yes    Attends Engineer, Structural: More than 4 times per year    Marital Status: Married  Catering Manager Violence: Not on file  Depression (PHQ2-9): Low Risk (01/07/2024)   Depression (PHQ2-9)    PHQ-2 Score: 1  Alcohol Screen: Low Risk (01/06/2024)   Alcohol Screen    Last Alcohol Screening Score (AUDIT): 2  Housing: Low Risk (01/06/2024)   Housing  Stability Vital Sign    Unable to Pay for Housing in the Last Year: No    Number of Times Moved in the Last Year: 0    Homeless in the Last Year: No  Utilities: Not on file  Health Literacy: Not on file    Physical Exam: Vital signs in last 24 hours: @BP  (!) 145/93   Pulse 87   Temp 98.2 F (36.8 C)   Ht 5' 10 (1.778 m)   Wt 231 lb (104.8 kg)   SpO2 99%   BMI 33.15 kg/m  GEN: NAD EYE: Sclerae anicteric ENT: MMM CV: Non-tachycardic Pulm: CTA b/l GI: Soft, NT/ND NEURO:  Alert & Oriented  x 3   Sandor Flatter, DO [facility]   10/02/2024 11:38 AM

## 2024-10-02 NOTE — Op Note (Signed)
 [facility] Patient Name: Marc Barber Procedure Date: 10/02/2024 11:40 AM MRN: [id number] Endoscopist: Sandor Flatter , MD, [id number] Age: 53 Referring MD:  Date of Birth: [birth year] Gender: Male Account #: [id number] Procedure:                Upper GI endoscopy Indications:              Dysphagia, Esophageal reflux, For therapy of                            esophageal stricture Medicines:                Monitored Anesthesia Care Procedure:                Pre-Anesthesia Assessment:                           - Prior to the procedure, a History and Physical                            was performed, and patient medications and                            allergies were reviewed. The patient's tolerance of                            previous anesthesia was also reviewed. The risks                            and benefits of the procedure and the sedation                            options and risks were discussed with the patient.                            All questions were answered, and informed consent                            was obtained. Prior Anticoagulants: The patient has                            taken no anticoagulant or antiplatelet agents. ASA                            Grade Assessment: II - A patient with mild systemic                            disease. After reviewing the risks and benefits,                            the patient was deemed in satisfactory condition to                            undergo the procedure.  After obtaining informed consent, the endoscope was                            passed under direct vision. Throughout the                            procedure, the patient's blood pressure, pulse, and                            oxygen saturations were monitored continuously. The                            Olympus scope [id number] was introduced through the                            mouth, and advanced to the third part  of duodenum.                            The upper GI endoscopy was accomplished without                            difficulty. The patient tolerated the procedure                            well. Scope In: Scope Out: Findings:                 One benign-appearing, intrinsic mild stenosis was                            found 38 cm from the incisors. This stenosis                            measured less than one cm (in length). The stenosis                            was traversed. A TTS dilator was passed through the                            scope. Dilation with an 18-19-20 mm balloon dilator                            was performed to 20 mm. The dilation site was                            examined and showed mild mucosal disruption.                            Estimated blood loss was minimal.                           The Z-line was regular and was found 40 cm from the  incisors.                           The upper third of the esophagus and middle third                            of the esophagus were normal. Biopsies were                            obtained from the proximal and distal esophagus                            with cold forceps for histology of suspected                            eosinophilic esophagitis. Estimated blood loss was                            minimal.                           Two small sessile polyps with no bleeding and no                            stigmata of recent bleeding were found in the                            gastric body. These polyps were removed with a cold                            biopsy forceps. Resection and retrieval were                            complete. Estimated blood loss was minimal.                           Normal mucosa was found in the entire examined                            stomach.                           The examined duodenum was normal. Complications:            No immediate  complications. Estimated Blood Loss:     Estimated blood loss was minimal. Impression:               - Benign-appearing esophageal stenosis. Dilated                            with 20 mm TTS balloon with appropriate mucosal                            disruption. Biopsies were taken with a cold forceps  for evaluation of eosinophilic esophagitis.                           - Z-line regular, 40 cm from the incisors.                           - Normal upper third of esophagus and middle third                            of esophagus.                           - Two gastric polyps. Resected and retrieved.                           - Normal mucosa was found in the entire stomach.                           - Normal examined duodenum. Recommendation:           - Patient has a contact number available for                            emergencies. The signs and symptoms of potential                            delayed complications were discussed with the                            patient. Return to normal activities tomorrow.                            Written discharge instructions were provided to the                            patient.                           - Soft diet today then advance as tolerated                            tomorrow per post dilation protocol.                           - Continue present medications.                           - Await pathology results.                           - Repeat upper endoscopy PRN for retreatment.                           - Return to GI clinic PRN. Sandor Flatter, MD 10/02/2024 12:36:47 PM

## 2024-10-02 NOTE — Patient Instructions (Addendum)
 Handouts provided on polyps and dilation diet.  Pathology results and follow up recommendations will be sent via MyChart or letter.    YOU HAD AN ENDOSCOPIC PROCEDURE TODAY AT THE [facility]:   Refer to the procedure report that was given to you for any specific questions about what was found during the examination.  If the procedure report does not answer your questions, please call your gastroenterologist to clarify.  If you requested that your care partner not be given the details of your procedure findings, then the procedure report has been included in a sealed envelope for you to review at your convenience later.  YOU SHOULD EXPECT: Some feelings of bloating in the abdomen. Passage of more gas than usual.  Walking can help get rid of the air that was put into your GI tract during the procedure and reduce the bloating. If you had a lower endoscopy (such as a colonoscopy or flexible sigmoidoscopy) you may notice spotting of blood in your stool or on the toilet paper. If you underwent a bowel prep for your procedure, you may not have a normal bowel movement for a few days.  Please Note:  You might notice some irritation and congestion in your nose or some drainage.  This is from the oxygen used during your procedure.  There is no need for concern and it should clear up in a day or so.  SYMPTOMS TO REPORT IMMEDIATELY:  Following lower endoscopy (colonoscopy or flexible sigmoidoscopy):  Excessive amounts of blood in the stool  Significant tenderness or worsening of abdominal pains  Swelling of the abdomen that is new, acute  Fever of 100F or higher  Following upper endoscopy (EGD)  Vomiting of blood or coffee ground material  New chest pain or pain under the shoulder blades  Painful or persistently difficult swallowing  New shortness of breath  Fever of 100F or higher  Black, tarry-looking stools  For urgent or emergent issues, a gastroenterologist can be reached at any hour  by calling [phone]. Do not use MyChart messaging for urgent concerns.    DIET:  Follow dilation diet handout.  Drink plenty of fluids but you should avoid alcoholic beverages for 24 hours.  ACTIVITY:  You should plan to take it easy for the rest of today and you should NOT DRIVE or use heavy machinery until tomorrow (because of the sedation medicines used during the test).    FOLLOW UP: Our staff will call the number listed on your records the next business day following your procedure.  We will call around 7:15- 8:00 am to check on you and address any questions or concerns that you may have regarding the information given to you following your procedure. If we do not reach you, we will leave a message.     If any biopsies were taken you will be contacted by phone or by letter within the next 1-3 weeks.  Please call us  at [phone] if you have not heard about the biopsies in 3 weeks.    SIGNATURES/CONFIDENTIALITY: You and/or your care partner have signed paperwork which will be entered into your electronic medical record.  These signatures attest to the fact that that the information above on your After Visit Summary has been reviewed and is understood.  Full responsibility of the confidentiality of this discharge information lies with you and/or your care-partner.

## 2024-10-02 NOTE — Progress Notes (Signed)
 VS by EJ

## 2024-10-02 NOTE — Progress Notes (Signed)
1140 Robinul 0.1 mg IV given due large amount of secretions upon assessment.  MD made aware, vss

## 2024-10-02 NOTE — Op Note (Signed)
 [facility] Patient Name: Marc Barber Procedure Date: 10/02/2024 11:39 AM MRN: [id number] Endoscopist: Sandor Flatter , MD, [id number] Age: 53 Referring MD:  Date of Birth: 25-Sep-1971 Gender: Male Account #: [id number] Procedure:                Colonoscopy Indications:              Screening for colorectal malignant neoplasm (last                            colonoscopy was more than 10 years ago) Medicines:                Monitored Anesthesia Care Procedure:                Pre-Anesthesia Assessment:                           - Prior to the procedure, a History and Physical                            was performed, and patient medications and                            allergies were reviewed. The patient's tolerance of                            previous anesthesia was also reviewed. The risks                            and benefits of the procedure and the sedation                            options and risks were discussed with the patient.                            All questions were answered, and informed consent                            was obtained. Prior Anticoagulants: The patient has                            taken no anticoagulant or antiplatelet agents. ASA                            Grade Assessment: II - A patient with mild systemic                            disease. After reviewing the risks and benefits,                            the patient was deemed in satisfactory condition to                            undergo the procedure.  After obtaining informed consent, the colonoscope                            was passed under direct vision. Throughout the                            procedure, the patient's blood pressure, pulse, and                            oxygen saturations were monitored continuously. The                            CF [id number] #[id number] was introduced through the anus                            and advanced to  the the cecum, identified by                            appendiceal orifice and ileocecal valve. The                            colonoscopy was performed without difficulty. The                            patient tolerated the procedure well. The quality                            of the bowel preparation was good. The ileocecal                            valve, appendiceal orifice, and rectum were                            photographed. Scope In: 11:57:25 AM Scope Out: 12:07:13 PM Scope Withdrawal Time: 0 hours 7 minutes 49 seconds  Total Procedure Duration: 0 hours 9 minutes 48 seconds  Findings:                 The perianal and digital rectal examinations were                            normal.                           A 4 mm polyp was found in the sigmoid colon. The                            polyp was sessile. The polyp was removed with a                            cold snare. Resection and retrieval were complete.                            Estimated blood loss was minimal.  Multiple medium-mouthed and small-mouthed                            diverticula were found in the sigmoid colon and                            transverse colon.                           Non-bleeding internal hemorrhoids were found during                            retroflexion. The hemorrhoids were small. Complications:            No immediate complications. Estimated Blood Loss:     Estimated blood loss was minimal. Impression:               - One 4 mm polyp in the sigmoid colon, removed with                            a cold snare. Resected and retrieved.                           - Diverticulosis in the sigmoid colon and in the                            transverse colon.                           - Non-bleeding internal hemorrhoids. Recommendation:           - Patient has a contact number available for                            emergencies. The signs and symptoms of potential                             delayed complications were discussed with the                            patient. Return to normal activities tomorrow.                            Written discharge instructions were provided to the                            patient.                           - Resume previous diet.                           - Continue present medications.                           - Await pathology results.                           -  Repeat colonoscopy for surveillance based on                            pathology results.                           - Return to GI clinic PRN. Sandor Flatter, MD 10/02/2024 12:38:55 PM

## 2024-10-02 NOTE — Progress Notes (Signed)
 Report given to PACU, vss

## 2024-10-02 NOTE — Progress Notes (Signed)
 Called to room to assist during endoscopic procedure.  Patient ID and intended procedure confirmed with present staff. Received instructions for my participation in the procedure from the performing physician.

## 2024-10-05 ENCOUNTER — Telehealth: Payer: Self-pay | Admitting: *Deleted

## 2024-10-05 NOTE — Telephone Encounter (Signed)
°  Follow up Call-     10/02/2024   11:02 AM  Call back number  Post procedure Call Back phone  # [phone]  Permission to leave phone message Yes     Patient questions:  Do you have a fever, pain , or abdominal swelling? No. Pain Score  0 *  Have you tolerated food without any problems? Yes.    Have you been able to return to your normal activities? Yes.    Do you have any questions about your discharge instructions: Diet   No. Medications  No. Follow up visit  No.  Do you have questions or concerns about your Care? No.  Actions: * If pain score is 4 or above: No action needed, pain <4.

## 2024-10-06 LAB — SURGICAL PATHOLOGY

## 2024-10-07 ENCOUNTER — Ambulatory Visit: Payer: Self-pay | Admitting: Gastroenterology

## 2025-01-11 ENCOUNTER — Encounter: Admitting: Family Medicine

## 2025-01-12 ENCOUNTER — Encounter: Admitting: Family Medicine
